# Patient Record
Sex: Female | Born: 1960 | Race: White | Hispanic: No | Marital: Married | Smoking: Former smoker
Health system: Southern US, Community
[De-identification: ages and names within clinical notes are randomized; demographics above are authoritative.]

## PROBLEM LIST (undated history)

## (undated) DIAGNOSIS — N289 Disorder of kidney and ureter, unspecified: Secondary | ICD-10-CM

## (undated) HISTORY — PX: ABDOMINAL HYSTERECTOMY: SHX81

## (undated) HISTORY — PX: OTHER SURGICAL HISTORY: SHX169

---

## 2009-08-03 ENCOUNTER — Emergency Department (HOSPITAL_COMMUNITY): Admission: EM | Admit: 2009-08-03 | Discharge: 2009-08-03 | Payer: Self-pay | Admitting: Emergency Medicine

## 2009-08-06 ENCOUNTER — Ambulatory Visit: Payer: Self-pay | Admitting: Internal Medicine

## 2009-08-06 DIAGNOSIS — R1032 Left lower quadrant pain: Secondary | ICD-10-CM | POA: Insufficient documentation

## 2009-08-06 DIAGNOSIS — R933 Abnormal findings on diagnostic imaging of other parts of digestive tract: Secondary | ICD-10-CM

## 2009-08-06 DIAGNOSIS — R109 Unspecified abdominal pain: Secondary | ICD-10-CM

## 2009-08-07 ENCOUNTER — Encounter: Payer: Self-pay | Admitting: Internal Medicine

## 2009-08-07 ENCOUNTER — Telehealth (INDEPENDENT_AMBULATORY_CARE_PROVIDER_SITE_OTHER): Payer: Self-pay

## 2009-08-18 ENCOUNTER — Ambulatory Visit: Payer: Self-pay | Admitting: Internal Medicine

## 2009-08-18 ENCOUNTER — Ambulatory Visit (HOSPITAL_COMMUNITY): Admission: RE | Admit: 2009-08-18 | Discharge: 2009-08-18 | Payer: Self-pay | Admitting: Internal Medicine

## 2009-08-24 ENCOUNTER — Encounter: Payer: Self-pay | Admitting: Internal Medicine

## 2009-09-14 ENCOUNTER — Telehealth (INDEPENDENT_AMBULATORY_CARE_PROVIDER_SITE_OTHER): Payer: Self-pay

## 2010-07-20 NOTE — Letter (Signed)
Summary: TCS ORDER  TCS ORDER   Imported By: Ave Filter 08/06/2009 15:16:56  _____________________________________________________________________  External Attachment:    Type:   Image     Comment:   External Document

## 2010-07-20 NOTE — Assessment & Plan Note (Signed)
Summary: npp,abd pain,side pain,glu   Visit Type:  Initial Consult Referring Provider:  ER/ [facility] Primary Care Provider:  NONE  Chief Complaint:  Abd pain/ side pain.  History of Present Illness: [name] is a pleasant 50 year old Caucasian female who presents for further evaluation of abdominal pain. She was referred by Dr. Fonnie Jarvis at [facility] ED. She states about 4 weeks ago she started having back pain which radiated around the right flank. She has a history of kidney stones and felt that she was passing a stone. After a week or so she said the pain resolved. She went one week without any pain but then over the weekend she started having more pain in her back and in her lower abdomen. Because of persistent pain she presented to emergency department. She had a small amount of blood noted on urinalysis. CT of abdomen and pelvis without contrast showed stool burden suggestive of constipation, edema identified at the proximal aspect of the sigmoid colon anteriorly, no diverticula were seen in this area, no colonic wall thickening. Appendix appeared normal. It was felt that this likely represented epiploic appendagitis. CT was done 3 days ago. She states she continues to have lower abdominal pain in the suprapubic region and somewhat to the left lower quadrant. She denies any fever or chills. She states her BMs seem to be smaller. She noticed this the last few days. She generally has a bowel movement every 2-3 days. She has bloating and gas. Denies blood in stool or melena. She takes Motrin p.r.n. neck pain from arthritis. She does not take it daily. She denies any heartburn symptoms, nausea vomiting, weight loss, dysuria, hematuria.     Current Medications (verified): 1)  Motrin .... 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 Mat Aunt, lung cancer Pat aunts, breast cancer, multiple Pat uncles, cancer Brother, liver dz, drug related.  Social History: Married. 3 children. One ppd. No alcohol. No drugs. 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  Review of Systems General:  Denies fever, chills, sweats, anorexia, fatigue, weakness, and weight loss. Eyes:  Denies vision loss. ENT:  Denies nasal congestion, sore throat, hoarseness, and difficulty swallowing. CV:  Denies chest pains, angina, palpitations, dyspnea on exertion, and peripheral edema. Resp:  Denies dyspnea at rest, dyspnea with exercise, and cough. GI:  See HPI. GU:  Denies urinary burning and blood in urine. MS:  Complains of joint pain / LOM. Derm:  Denies rash and itching. Neuro:  Denies weakness, frequent headaches, memory loss, and confusion. Psych:  Denies depression and anxiety. Endo:  Denies unusual weight change. Heme:  Denies bruising and bleeding. Allergy:  Denies hives and rash.  Vital Signs:  Patient profile:   50 year old female Height:      63 inches Weight:      120 pounds BMI:     21.33 Temp:     98.4 degrees F oral Pulse rate:   84 / minute BP sitting:   102 / 72  (right arm) Cuff size:   regular  Vitals Entered By: Cloria Spring LPN (August 06, 2009 2:11 PM)  Physical Exam  General:  Well developed, well nourished, no acute distress. Head:  Normocephalic and atraumatic. Eyes:  Conjunctivae pink, no  scleral icterus.  Mouth:  Oropharyngeal mucosa moist, pink.  No lesions, erythema or exudate.    Neck:  Supple; no masses or thyromegaly. Lungs:  Clear throughout to auscultation. Heart:  Regular rate and rhythm; no murmurs, rubs,  or bruits. Abdomen:  Soft. Normal BS. Mild suprapubic/LLQ tenderness. No HSM or masses. No abd hernia or bruit. No rebound or guarding. Rectal:  deferred until  time of colonoscopy.   Extremities:  No clubbing, cyanosis, edema or deformities noted. Neurologic:  Alert and  oriented x4;  grossly normal neurologically. Skin:  Intact without significant lesions or rashes. Cervical Nodes:  No significant cervical adenopathy. Psych:  Alert and cooperative. Normal mood and affect.  Impression & Recommendations:  Problem # 1:  ABDOMINAL PAIN, LEFT LOWER QUADRANT (ICD-789.04)  Acute onset LLQ/suprapubic abdominal pain began less than one week ago. Likely unrelated to recent flank/back pain (potentially passes a stone 2-3 weeks ago). CT s/o epiploic appendagitis. Unlikely diverticulitis given no apparent diverticula. CT somewhat limited given it was noncontrast study. Discussed with Dr. Jena Gauss. Short interval progress report from patient. Plan for colonoscopy in 2 weeks. This should be self-limiting illness and resolve in 7-10 days. She can take Aleve as needed. She declined narcotics. If she develops worsened pain, fever, vomiting, bloody stools she should go to ED. Colonoscopy to be performed in near future.  Risks, alternatives, and benefits including but not limited to the risk of reaction to medication, bleeding, infection, and perforation were addressed.  Patient voiced understanding and provided verbal consent.   Orders: Consultation Level III ([id number])   I would like to thank Dr. Fonnie Jarvis for allowing [place] to take part in the care of this nice patient.

## 2010-07-20 NOTE — Progress Notes (Signed)
Summary: Pt cancelled appt  Phone Note Call from Patient   Caller: Patient Summary of Call: Pt wants to cancel appt today. Will reschedule at a later date. Initial call taken by: Cloria Spring LPN,  September 14, 2009 10:06 AM

## 2010-07-20 NOTE — Progress Notes (Signed)
Summary: Pt still having some pain  Phone Note Call from Patient   Caller: Patient Summary of Call: Pt called to say she is concerned because she continues to have pain on right side and has started having pain on her left side. She said that Tana Coast, PA said yesterday that if she continued to have pain to call and she would put her on antibiotics since she may get an infection in her colon. Her procedure is scheduled for 08/18/2009. She said she would feel better to start antibiotics, if Verlon Au feels the same, she's anxious about the week-end coming up, if she started getting worse. Please advise. Pt said to call Rx to [facility]. Initial call taken by: Cloria Spring LPN,  August 07, 2009 10:21 AM     Appended Document: abx Will give abx just to cover potential infection ie diverticulitis, but symptoms will likely settle down in next one week if from epiploic appendagitis. If she develops fever, worsening or severe abd pain, blood in stool she should go to ed.   Prescriptions: METRONIDAZOLE 500 MG TABS (METRONIDAZOLE) one by mouth two times a day for 10 days  #20 x 0   Entered and Authorized by:   Leanna Battles. Dixon Boos   Signed by:   Leanna Battles Lewis PA-C on 08/07/2009   Method used:   Electronically to        [facility] (retail)       [address]       [place], [place]  [address]       Ph: [phone]       Fax: [phone]   RxID:   [id number] CIPROFLOXACIN HCL 500 MG TABS (CIPROFLOXACIN HCL) one by mouth two times a day for 10 days  #20 x 0   Entered and Authorized by:   Leanna Battles. Dixon Boos   Signed by:   Leanna Battles Lewis PA-C on 08/07/2009   Method used:   Electronically to        [facility] (retail)       [address]       [place], [place]  [address]       Ph: [phone]       Fax: [phone]   RxID:   [id number]     Appended Document: Pt still having some pain tried to call pt-  NA  Appended Document: Pt still having some pain Called and pt not home. Told son her Rx's had been called in. He said she would not be home for another hour.  Appended Document: Pt still having some pain Pt informed.

## 2010-07-20 NOTE — Letter (Signed)
Summary: REFERRAL/ER  REFERRAL/ER   Imported By: Diana Eves 08/07/2009 15:18:41  _____________________________________________________________________  External Attachment:    Type:   Image     Comment:   External Document

## 2010-07-20 NOTE — Letter (Signed)
Summary: Patient Notice, Colon Biopsy Results  [facility]  [address]   [place], [place] [address]   Phone: [phone]  Fax: [phone]       August 24, 2009   Brittney Larson [address] [place], [place]  [address] 23-May-1961    Dear [name],  I am pleased to inform you that the biopsies taken during your recent colonoscopy did not show any evidence of cancer upon pathologic examination.  Additional information/recommendations:  You should have a repeat colonoscopy examination  in 3 years.  Please call us if you are having persistent problems or have questions about your condition that have not been fully answered at this time.  Sincerely,    R. Roetta Sessions MD  [facility] Ph: [phone]    Fax: [phone]

## 2010-09-09 LAB — URINALYSIS, ROUTINE W REFLEX MICROSCOPIC
Bilirubin Urine: NEGATIVE
Glucose, UA: NEGATIVE mg/dL
Ketones, ur: NEGATIVE mg/dL
Leukocytes, UA: NEGATIVE
Nitrite: NEGATIVE
Protein, ur: NEGATIVE mg/dL
Urobilinogen, UA: 0.2 mg/dL (ref 0.0–1.0)

## 2010-09-09 LAB — URINE MICROSCOPIC-ADD ON

## 2011-11-09 DIAGNOSIS — R079 Chest pain, unspecified: Secondary | ICD-10-CM

## 2012-08-27 ENCOUNTER — Encounter: Payer: Self-pay | Admitting: Internal Medicine

## 2015-02-08 ENCOUNTER — Encounter (HOSPITAL_COMMUNITY): Payer: Self-pay | Admitting: Emergency Medicine

## 2015-02-08 ENCOUNTER — Emergency Department (HOSPITAL_COMMUNITY)
Admission: EM | Admit: 2015-02-08 | Discharge: 2015-02-08 | Disposition: A | Discharge status: Home / Self Care | Payer: Self-pay | Attending: Emergency Medicine | Admitting: Emergency Medicine

## 2015-02-08 ENCOUNTER — Emergency Department (HOSPITAL_COMMUNITY): Payer: Self-pay

## 2015-02-08 DIAGNOSIS — Y9289 Other specified places as the place of occurrence of the external cause: Secondary | ICD-10-CM | POA: Insufficient documentation

## 2015-02-08 DIAGNOSIS — S62112A Displaced fracture of triquetrum [cuneiform] bone, left wrist, initial encounter for closed fracture: Secondary | ICD-10-CM | POA: Insufficient documentation

## 2015-02-08 DIAGNOSIS — Z87891 Personal history of nicotine dependence: Secondary | ICD-10-CM | POA: Insufficient documentation

## 2015-02-08 DIAGNOSIS — W01198A Fall on same level from slipping, tripping and stumbling with subsequent striking against other object, initial encounter: Secondary | ICD-10-CM | POA: Insufficient documentation

## 2015-02-08 DIAGNOSIS — S62102A Fracture of unspecified carpal bone, left wrist, initial encounter for closed fracture: Secondary | ICD-10-CM

## 2015-02-08 DIAGNOSIS — Y998 Other external cause status: Secondary | ICD-10-CM | POA: Insufficient documentation

## 2015-02-08 DIAGNOSIS — Y9302 Activity, running: Secondary | ICD-10-CM | POA: Insufficient documentation

## 2015-02-08 DIAGNOSIS — Z87442 Personal history of urinary calculi: Secondary | ICD-10-CM | POA: Insufficient documentation

## 2015-02-08 HISTORY — DX: Disorder of kidney and ureter, unspecified: N28.9

## 2015-02-08 MED ORDER — HYDROCODONE-ACETAMINOPHEN 5-325 MG PO TABS: 1.0000 | ORAL_TABLET | ORAL | Status: AC | PRN

## 2015-02-08 MED ORDER — HYDROCODONE-ACETAMINOPHEN 5-325 MG PO TABS
1.0000 | ORAL_TABLET | Freq: Once | ORAL | Status: AC
  Administered 2015-02-08: 1 via ORAL
  Filled 2015-02-08: qty 1

## 2015-02-08 NOTE — ED Notes (Signed)
Pt was running back down steps when she slipped and fell - struck lt arm - pain , swelling to underside of forearm - Able to bend elbow - has some numbness little finger

## 2015-02-08 NOTE — Discharge Instructions (Signed)
Cast or Splint Care Casts and splints support injured limbs and keep bones from moving while they heal.  HOME CARE  Keep the cast or splint uncovered during the drying period.  A plaster cast can take 24 to 48 hours to dry.  A fiberglass cast will dry in less than 1 hour.  Do not rest the cast on anything harder than a pillow for 24 hours.  Do not put weight on your injured limb. Do not put pressure on the cast. Wait for your doctor's approval.  Keep the cast or splint dry.  Cover the cast or splint with a plastic bag during baths or wet weather.  If you have a cast over your chest and belly (trunk), take sponge baths until the cast is taken off.  If your cast gets wet, dry it with a towel or blow dryer. Use the cool setting on the blow dryer.  Keep your cast or splint clean. Wash a dirty cast with a damp cloth.  Do not put any objects under your cast or splint.  Do not scratch the skin under the cast with an object. If itching is a problem, use a blow dryer on a cool setting over the itchy area.  Do not trim or cut your cast.  Do not take out the padding from inside your cast.  Exercise your joints near the cast as told by your doctor.  Raise (elevate) your injured limb on 1 or 2 pillows for the first 1 to 3 days. GET HELP IF:  Your cast or splint cracks.  Your cast or splint is too tight or too loose.  You itch badly under the cast.  Your cast gets wet or has a soft spot.  You have a bad smell coming from the cast.  You get an object stuck under the cast.  Your skin around the cast becomes red or sore.  You have new or more pain after the cast is put on. GET HELP RIGHT AWAY IF:  You have fluid leaking through the cast.  You cannot move your fingers or toes.  Your fingers or toes turn blue or white or are cool, painful, or puffy (swollen).  You have tingling or lose feeling (numbness) around the injured area.  You have bad pain or pressure under the  cast.  You have trouble breathing or have shortness of breath.  You have chest pain. Document Released: 10/06/2010 Document Revised: 02/06/2013 Document Reviewed: 12/13/2012 [facility] Patient Information 2015 [place], [place]. This information is not intended to replace advice given to you by your health care provider. Make sure you discuss any questions you have with your health care provider.  Wrist Fracture A wrist fracture is a break or crack in one of the bones of your wrist. Your wrist is made up of eight small bones at the palm of your hand (carpal bones) and two long bones that make up your forearm (radius and ulna).  CAUSES   A direct blow to the wrist.  Falling on an outstretched hand.  Trauma, such as a car accident or a fall. RISK FACTORS Risk factors for wrist fracture include:   Participating in contact and high-risk sports, such as skiing, biking, and ice skating.  Taking steroid medicines.  Smoking.  Being female.  Being Caucasian.  Drinking more than three alcoholic beverages per day.  Having low or lowered bone density (osteoporosis or osteopenia).  Age. Older adults have decreased bone density.  Women who have had menopause.  History of previous fractures. SIGNS AND SYMPTOMS Symptoms of wrist fractures include tenderness, bruising, and inflammation. Additionally, the wrist may hang in an odd position or appear deformed.  DIAGNOSIS Diagnosis may include:  Physical exam.  X-ray. TREATMENT Treatment depends on many factors, including the nature and location of the fracture, your age, and your activity level. Treatment for wrist fracture can be nonsurgical or surgical.  Nonsurgical Treatment A plaster cast or splint may be applied to your wrist if the bone is in a good position. If the fracture is not in good position, it may be necessary for your health care provider to realign it before applying a splint or cast. Usually, a cast or splint will be worn  for several weeks.  Surgical Treatment Sometimes the position of the bone is so far out of place that surgery is required to apply a device to hold it together as it heals. Depending on the fracture, there are a number of options for holding the bone in place while it heals, such as a cast and metal pins.  HOME CARE INSTRUCTIONS  Keep your injured wrist elevated and move your fingers as much as possible.  Do not put pressure on any part of your cast or splint. It may break.   Use a plastic bag to protect your cast or splint from water while bathing or showering. Do not lower your cast or splint into water.  Take medicines only as directed by your health care provider.  Keep your cast or splint clean and dry. If it becomes wet, damaged, or suddenly feels too tight, contact your health care provider right away.  Do not use any tobacco products including cigarettes, chewing tobacco, or electronic cigarettes. Tobacco can delay bone healing. If you need help quitting, ask your health care provider.  Keep all follow-up visits as directed by your health care provider. This is important.  Ask your health care provider if you should take supplements of calcium and vitamins C and D to promote bone healing. SEEK MEDICAL CARE IF:   Your cast or splint is damaged, breaks, or gets wet.  You have a fever.  You have chills.  You have continued severe pain or more swelling than you did before the cast was put on. SEEK IMMEDIATE MEDICAL CARE IF:   Your hand or fingernails on the injured arm turn blue or gray, or feel cold or numb.  You have decreased feeling in the fingers of your injured arm. MAKE SURE YOU:  Understand these instructions.  Will watch your condition.  Will get help right away if you are not doing well or get worse. Document Released: 03/16/2005 Document Revised: 10/21/2013 Document Reviewed: 06/24/2011 [facility] Patient Information 2015 [place], [place]. This information is  not intended to replace advice given to you by your health care provider. Make sure you discuss any questions you have with your health care provider.   You may take the hydrocodone prescribed for pain relief.  This will make you drowsy - do not drive within 4 hours of taking this medication.

## 2015-02-10 NOTE — ED Provider Notes (Signed)
CSN: [id number]     Arrival date & time 02/08/15  2010 History   First MD Initiated Contact with Patient 02/08/15 2026     Chief Complaint  Patient presents with  . Arm Injury     (Consider location/radiation/quality/duration/timing/severity/associated sxs/prior Treatment) The history is provided by the patient.   Brittney Larson is a 54 y.o. female  With complaint of left forearm and lateral hand pain.  She describes running down her steps when she slipped and fell, striking her arm and hand the step edges as she slid down the steps today.  She denies head injury, dizziness or loc prior to or after the event.  She has pain mid forearm along with swelling and tingling in her 5th finger since the event.  She has had no treatment prior to arrival for her injury.   Past Medical History  Diagnosis Date  . Renal disorder     Kidney stones   Past Surgical History  Procedure Laterality Date  . Abdominal hysterectomy    . Colonocscopy     History reviewed. No pertinent family history. Social History  Substance Use Topics  . Smoking status: Former Smoker    Types: Cigarettes  . Smokeless tobacco: Never Used  . Alcohol Use: No   OB History    No data available     Review of Systems  Constitutional: Negative for fever.  Musculoskeletal: Positive for arthralgias. Negative for myalgias and joint swelling.  Skin: Negative for wound.  Neurological: Negative for weakness and numbness.      Allergies  Review of patient's allergies indicates no known allergies.  Home Medications   Prior to Admission medications   Medication Sig Start Date End Date Taking? Authorizing Provider  HYDROcodone-acetaminophen (NORCO/VICODIN) 5-325 MG per tablet Take 1 tablet by mouth every 4 (four) hours as needed. 02/08/15   Burgess Amor, PA-C   BP 163/101 mmHg  Pulse 101  Temp(Src) 97.9 F (36.6 C) (Oral)  Resp 18  Ht  (1.6 m)  Wt 160 lb (72.576 kg)  BMI 28.35 kg/m2  SpO2 97% Physical Exam   Constitutional: She appears well-developed and well-nourished.  HENT:  Head: Atraumatic.  Neck: Normal range of motion.  Cardiovascular:  Pulses equal bilaterally  Musculoskeletal: She exhibits tenderness.       Left forearm: She exhibits tenderness and swelling. She exhibits no deformity.       Right hand: She exhibits bony tenderness. She exhibits normal range of motion, normal two-point discrimination, normal capillary refill, no deformity and no swelling. Decreased sensation noted. Decreased sensation is present in the ulnar distribution. Normal strength noted. She exhibits no finger abduction and no thumb/finger opposition.  Edema and early bruising left lower volar forearm.  Compartments are soft.  Radial pulse full.  Less than 2 sec distal cap refill.  No deformity in hand,  ttp along 5th metacarpal.  Full grip strength. FROM of all fingers, but decreased but present sensation lateral 5th finger.  Neurological: She is alert. She has normal strength. She displays normal reflexes. No sensory deficit.  Skin: Skin is warm and dry.  Psychiatric: She has a normal mood and affect.    ED Course  Procedures (including critical care time) Labs Review Labs Reviewed - No data to display  Imaging Review Dg Forearm Left  02/08/2015   CLINICAL DATA:  Pt states she slipped and fell down a couple stairs and tried to catch herself with her left hand. Pt stats she is having  left lateral hand pain and pain in her left forearm. Pt has mild swelling in forearm and states she feels a tingling sensation down to her fingers.  EXAM: LEFT FOREARM - 2 VIEW  COMPARISON:  None.  FINDINGS: No fracture of the radius or ulna. Questionable triquetrum fracture on the lateral view. Wrist and elbow joints are normally spaced and aligned. Soft tissues are unremarkable.  IMPRESSION: Questionable fracture of the dorsal aspect of the triquetrum seen on the lateral view. No other evidence of a fracture. No forearm fracture. No  dislocation.   Electronically Signed   By: Amie Portland M.D.   On: 02/08/2015 21:04   Dg Hand Complete Left  02/08/2015   CLINICAL DATA:  Pt states she slipped and fell down a couple stairs and tried to catch herself with her left hand. Pt stats she is having left lateral hand pain and pain in her left forearm. Pt has mild swelling in forearm and states she feels a tingling sensation down to her fingers.  EXAM: LEFT HAND - COMPLETE 3+ VIEW  COMPARISON:  None.  FINDINGS: There is a fracture of the triquetrum. Nondisplaced fracture line is seen along the dorsal margin of the triquetrum on the lateral view. There is also a small bony fragment along the dorsal aspect of the triquetrum.  No other evidence of a fracture. The wrist joints are normally spaced and aligned.  There is dorsal soft tissue swelling.  IMPRESSION: Fractures of the triquetrum as described above. No other fractures. No dislocation.   Electronically Signed   By: Amie Portland M.D.   On: 02/08/2015 21:06   I have personally reviewed and evaluated these images and lab results as part of my medical decision-making.   EKG Interpretation None      MDM   Final diagnoses:  Wrist fracture, closed, left, initial encounter     Radiological studies were viewed, interpreted and considered during the medical decision making and disposition process. I agree with radiologists reading.  Results were also discussed with patient.   Pt was placed in ulnar gutter splint, sling, hydrocdone,  Advised ice, elevation.  F/u with ortho. Referrals given.   Burgess Amor, PA-C 02/10/15 1331  Vanetta Mulders, MD 02/13/15 1800

## 2015-12-25 IMAGING — DX DG FOREARM 2V*L*
2 series · 2 of 2 positions shown · non-contrast
Comparison: None.

CLINICAL DATA: Pt states she slipped and fell down a couple stairs
and tried to catch herself with her left hand. Pt stats she is
having left lateral hand pain and pain in her left forearm. Pt has
mild swelling in forearm and states she feels a tingling sensation
down to her fingers.

EXAM:
LEFT FOREARM - 2 VIEW

[forearm ap]
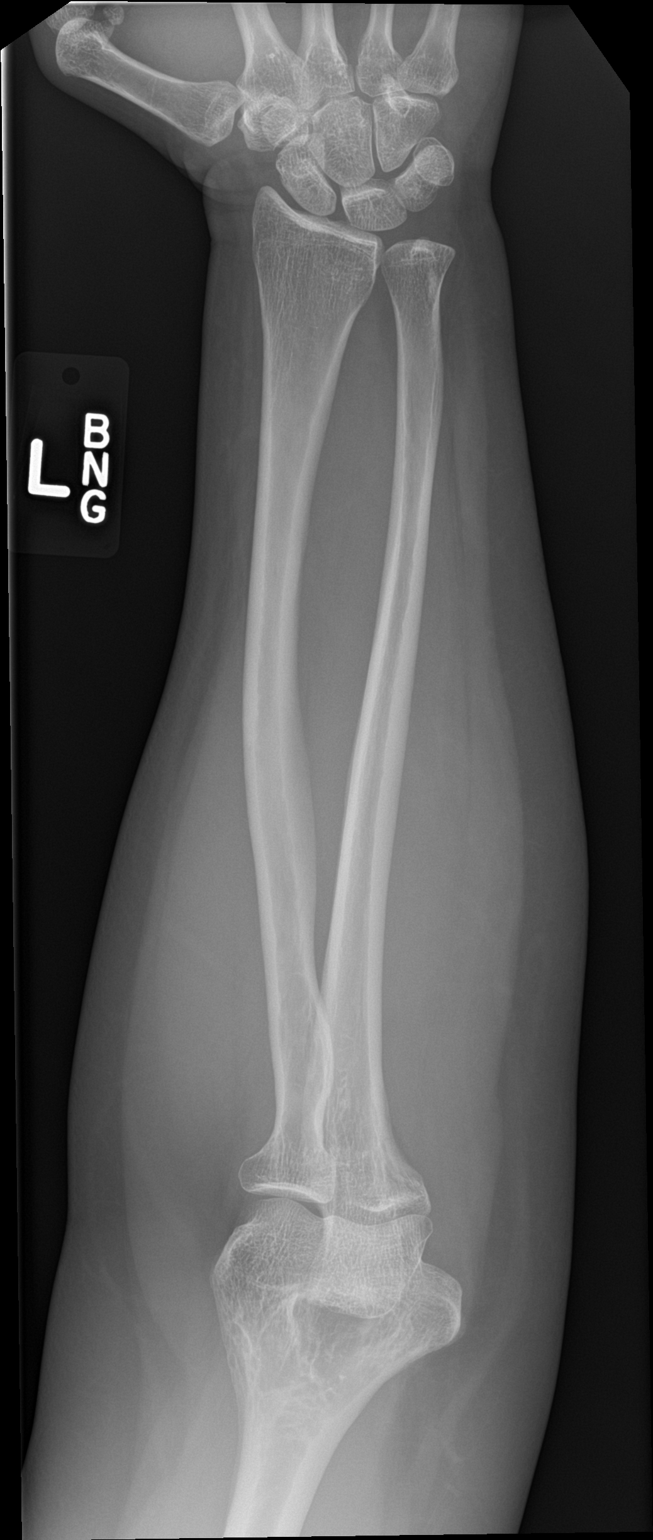

[forearm lat]
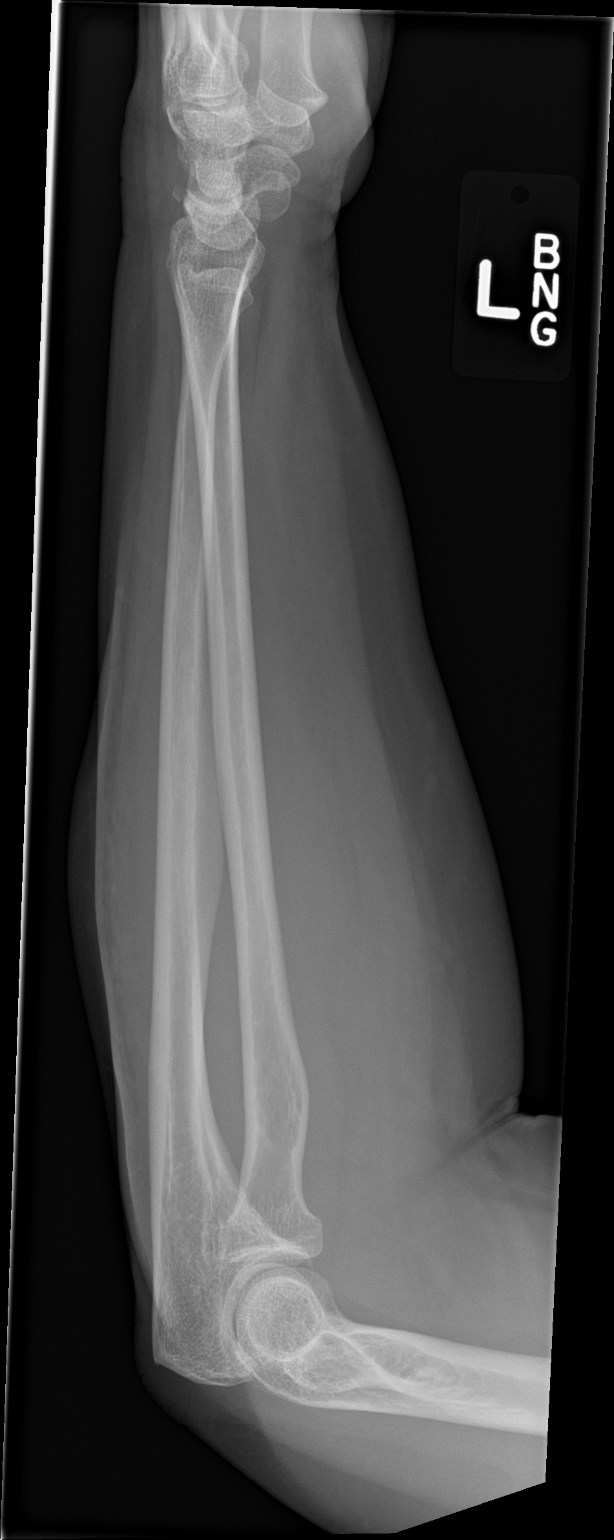

[2 of 2 positions shown; findings below may reference images not displayed]

FINDINGS: No fracture of the radius or ulna. Questionable triquetrum fracture
on the lateral view. Wrist and elbow joints are normally spaced and
aligned. Soft tissues are unremarkable.
IMPRESSION: Questionable fracture of the dorsal aspect of the triquetrum seen on
the lateral view. No other evidence of a fracture. No forearm
fracture. No dislocation.

## 2015-12-25 IMAGING — DX DG HAND COMPLETE 3+V*L*
3 series · 3 of 3 positions shown · non-contrast
Comparison: None.

CLINICAL DATA: Pt states she slipped and fell down a couple stairs
and tried to catch herself with her left hand. Pt stats she is
having left lateral hand pain and pain in her left forearm. Pt has
mild swelling in forearm and states she feels a tingling sensation
down to her fingers.

EXAM:
LEFT HAND - COMPLETE 3+ VIEW

[hand pa]
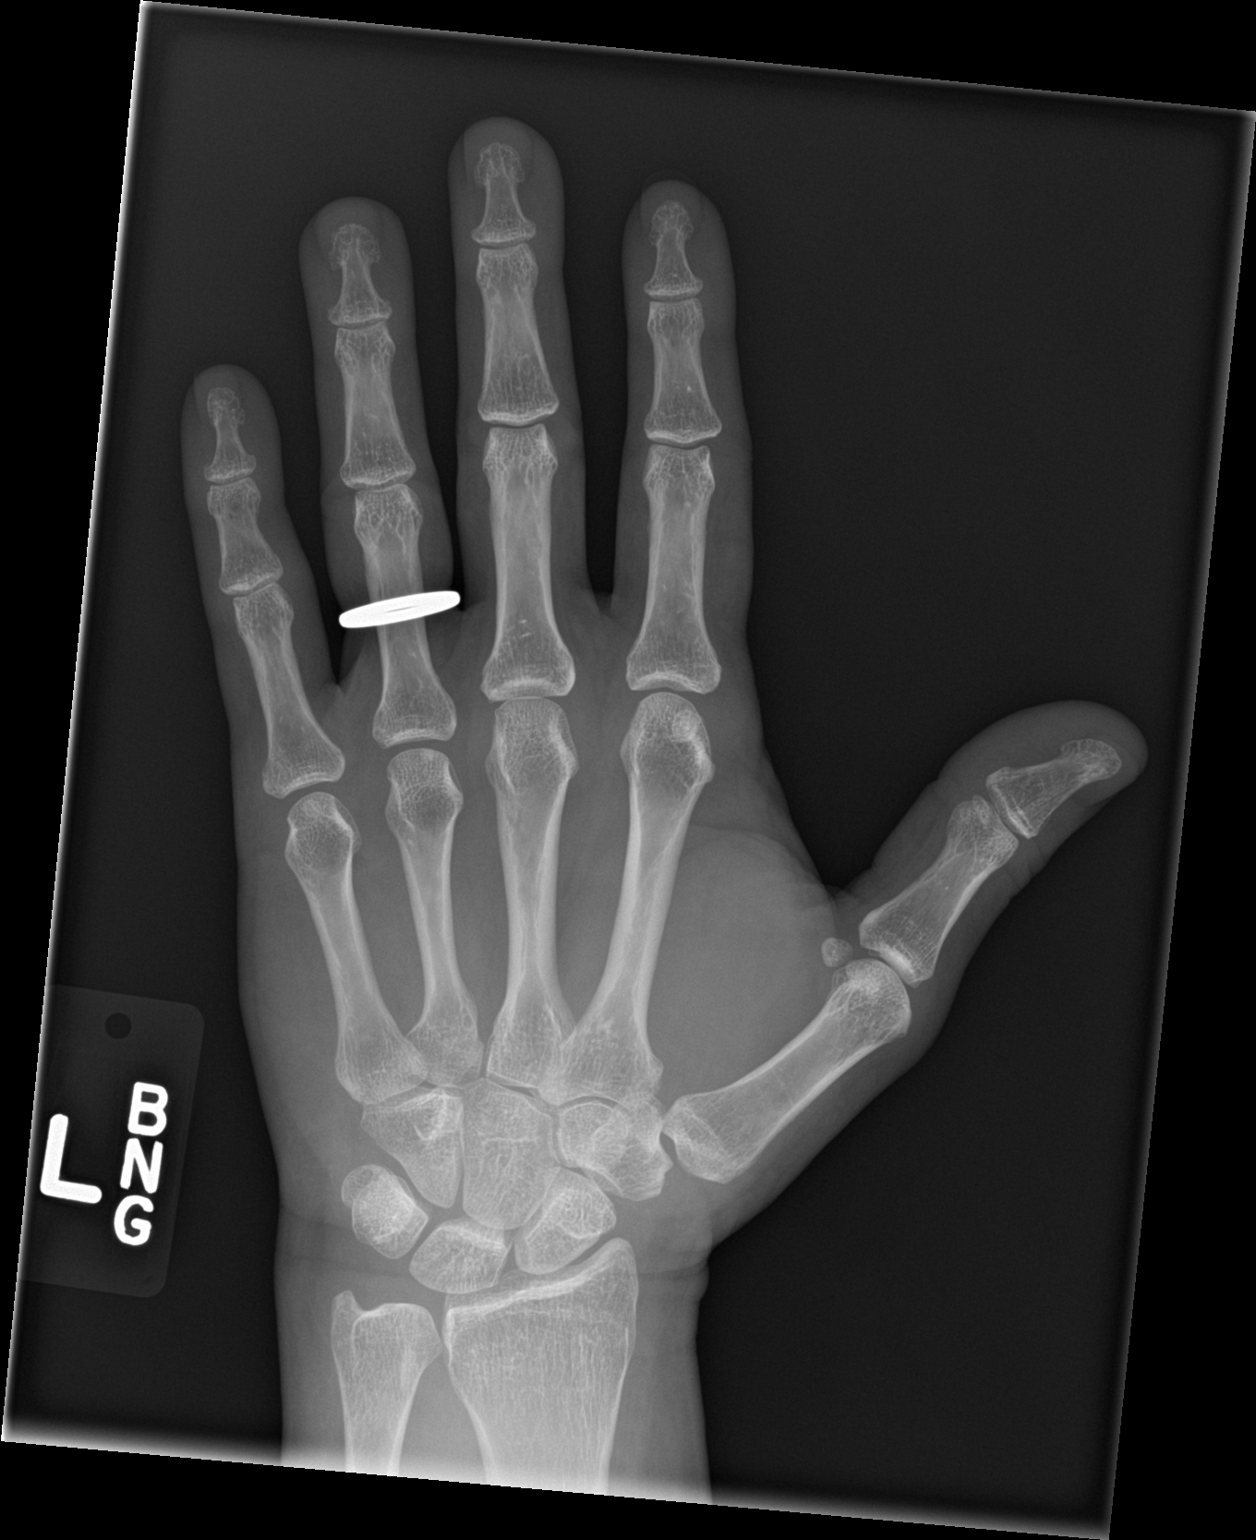

[hand obl]
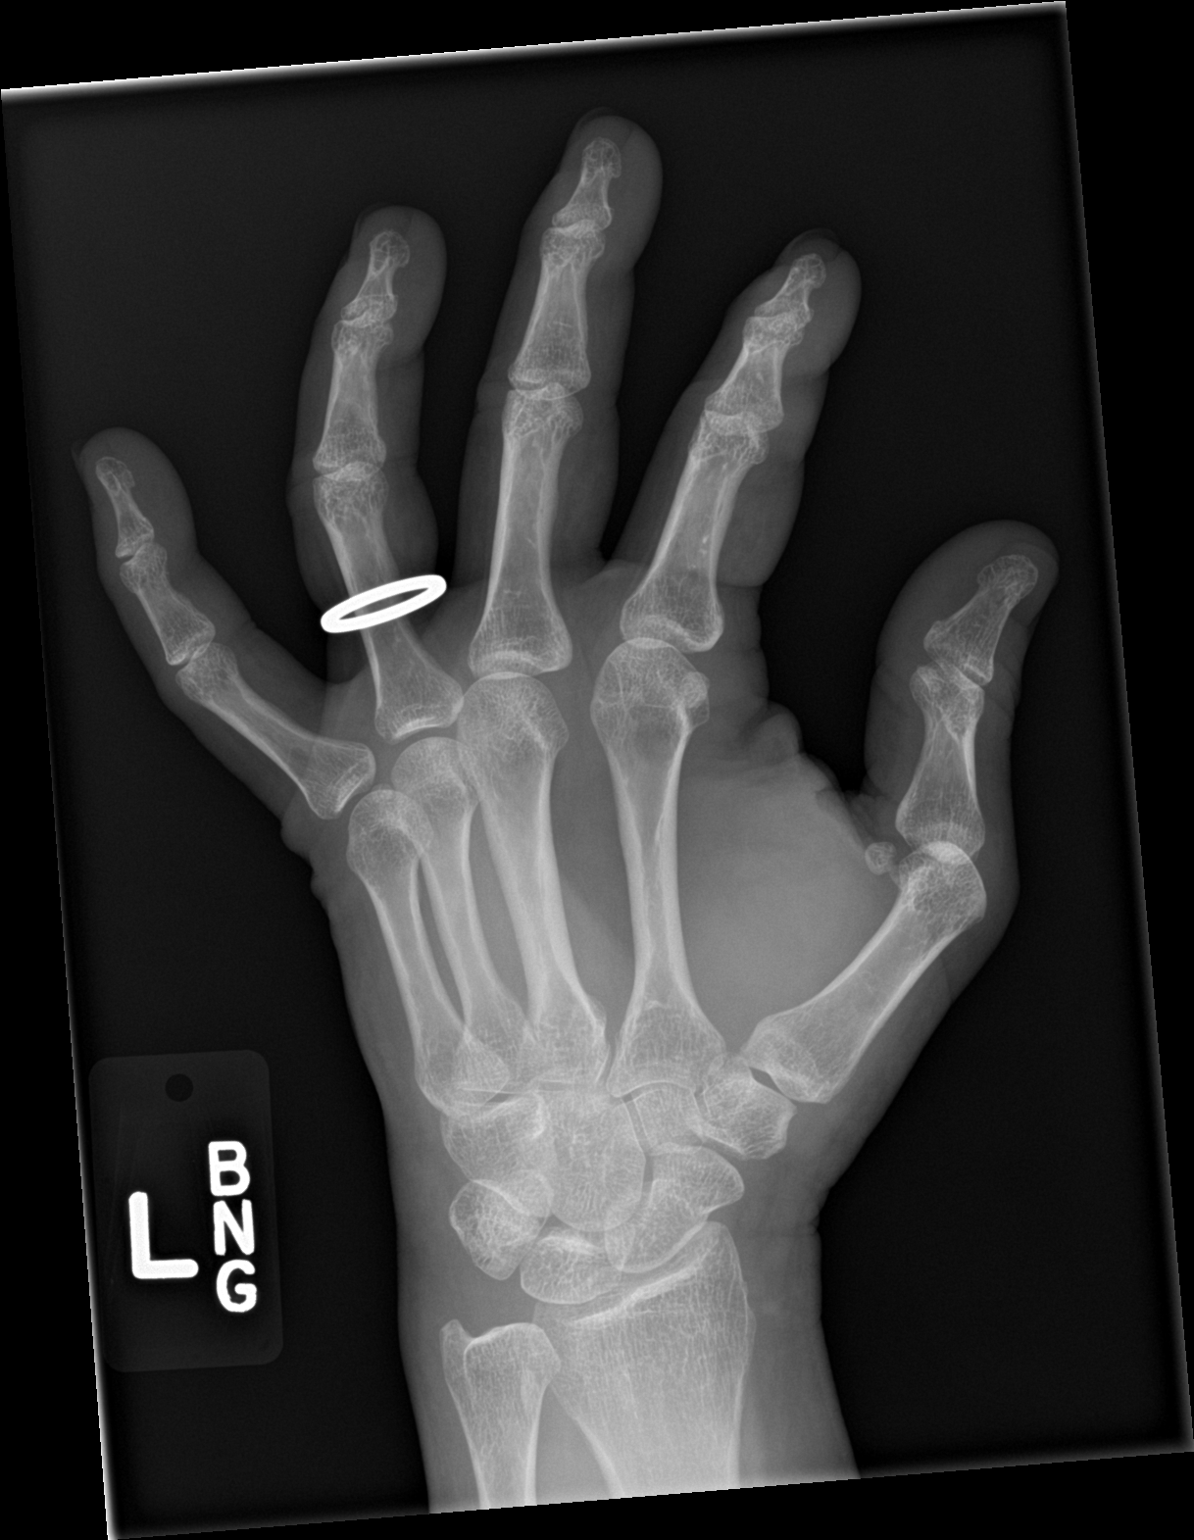

[hand lat]
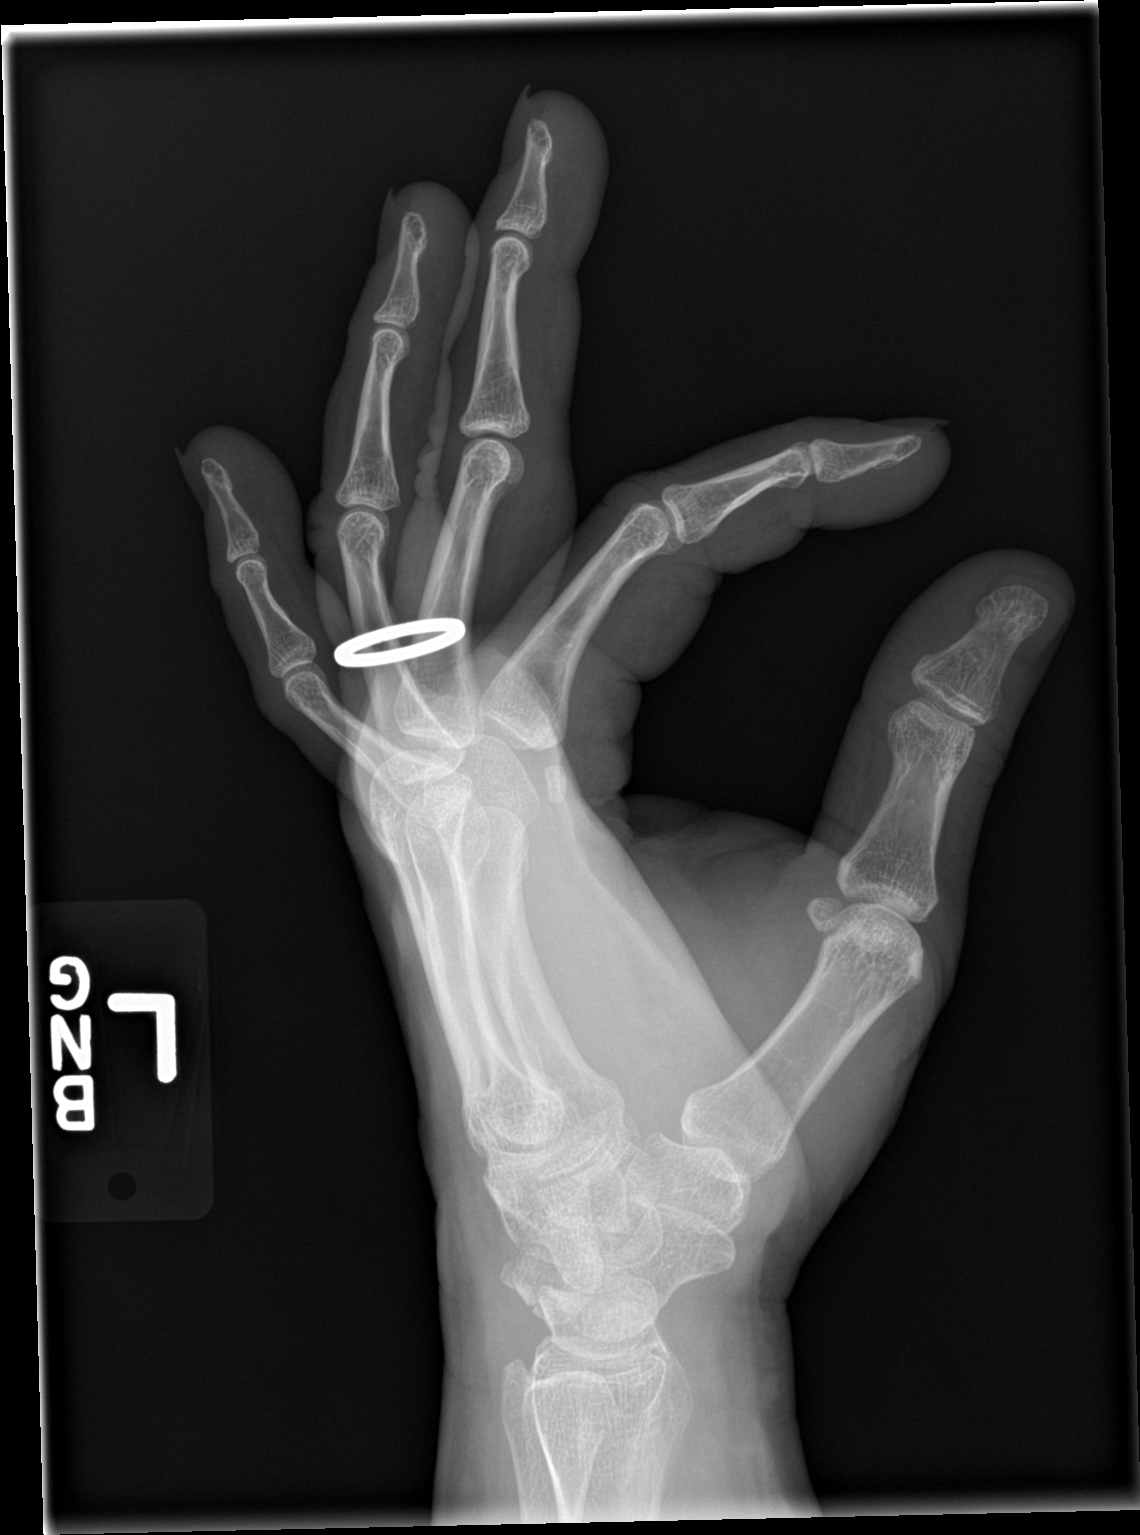

[3 of 3 positions shown; findings below may reference images not displayed]

FINDINGS: There is a fracture of the triquetrum. Nondisplaced fracture line is
seen along the dorsal margin of the triquetrum on the lateral view.
There is also a small bony fragment along the dorsal aspect of the
triquetrum.

No other evidence of a fracture. The wrist joints are normally
spaced and aligned.

There is dorsal soft tissue swelling.
IMPRESSION: Fractures of the triquetrum as described above. No other fractures.
No dislocation.
